# Patient Record
Sex: Male | Born: 2000 | Race: Black or African American | Hispanic: No | Marital: Single | Smoking: Never smoker
Health system: Southern US, Community
[De-identification: ages and names within clinical notes are randomized; demographics above are authoritative.]

## PROBLEM LIST (undated history)

## (undated) DIAGNOSIS — J189 Pneumonia, unspecified organism: Secondary | ICD-10-CM

## (undated) DIAGNOSIS — F909 Attention-deficit hyperactivity disorder, unspecified type: Secondary | ICD-10-CM

## (undated) HISTORY — DX: Pneumonia, unspecified organism: J18.9

---
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
## 2020-07-23 ENCOUNTER — Other Ambulatory Visit: Payer: Self-pay

## 2020-07-23 ENCOUNTER — Emergency Department (HOSPITAL_COMMUNITY)
Admission: EM | Admit: 2020-07-23 | Discharge: 2020-07-23 | Disposition: A | Discharge status: Home / Self Care | Payer: Medicaid Other | Attending: Emergency Medicine | Admitting: Emergency Medicine

## 2020-07-23 ENCOUNTER — Encounter (HOSPITAL_COMMUNITY): Payer: Self-pay | Admitting: Emergency Medicine

## 2020-07-23 DIAGNOSIS — S0181XA Laceration without foreign body of other part of head, initial encounter: Secondary | ICD-10-CM

## 2020-07-23 DIAGNOSIS — Z23 Encounter for immunization: Secondary | ICD-10-CM | POA: Insufficient documentation

## 2020-07-23 DIAGNOSIS — W500XXA Accidental hit or strike by another person, initial encounter: Secondary | ICD-10-CM | POA: Insufficient documentation

## 2020-07-23 DIAGNOSIS — Y9362 Activity, american flag or touch football: Secondary | ICD-10-CM | POA: Insufficient documentation

## 2020-07-23 DIAGNOSIS — S01112A Laceration without foreign body of left eyelid and periocular area, initial encounter: Secondary | ICD-10-CM | POA: Insufficient documentation

## 2020-07-23 MED ORDER — TETANUS-DIPHTH-ACELL PERTUSSIS 5-2.5-18.5 LF-MCG/0.5 IM SUSY
0.5000 mL | PREFILLED_SYRINGE | Freq: Once | INTRAMUSCULAR | Status: AC
  Administered 2020-07-23: 0.5 mL via INTRAMUSCULAR
  Filled 2020-07-23: qty 0.5

## 2020-07-23 MED ORDER — LIDOCAINE-EPINEPHRINE 1 %-1:100000 IJ SOLN
20.0000 mL | Freq: Once | INTRAMUSCULAR | Status: AC
  Administered 2020-07-23: 20 mL
  Filled 2020-07-23: qty 1

## 2020-07-23 MED ORDER — ACETAMINOPHEN 500 MG PO TABS
1000.0000 mg | ORAL_TABLET | Freq: Once | ORAL | Status: AC
  Administered 2020-07-23: 1000 mg via ORAL
  Filled 2020-07-23: qty 2
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
## 2020-07-23 NOTE — ED Provider Notes (Signed)
[facility] EMERGENCY DEPARTMENT Provider Note   CSN: [id number] Arrival date & time: 07/23/20  1658     History Chief Complaint  Patient presents with  . Laceration    Marcus Peters is a 20 y.o. male.  The history is provided by the patient. No language interpreter was used.  Laceration Associated symptoms: no fever      20 year old male significant history of ADHD presenting for evaluation of facial injury.  Patient report prior to arrival he was playing flag football when his head collided against another player.  He suffered a laceration to his left side of face.  He denies falling or having any loss of consciousness but does report of sharp throbbing pain to the affected area.  No pain with eye movement no vision loss no focal numbness or weakness no neck pain no other injury.  He is unable to recall his last tetanus status.  He denies any specific treatment tried.  He denies any other injury.  Pain is nonradiating.  Past Medical History:  Diagnosis Date  . ADHD (attention deficit hyperactivity disorder)   . Pneumonia     There are no problems to display for this patient.   History reviewed. No pertinent surgical history.     Family History  Problem Relation Age of Onset  . Asthma Brother   . Allergies Brother     Social History   Tobacco Use  . Smoking status: Never Smoker  . Smokeless tobacco: Never Used  Vaping Use  . Vaping Use: Never used  Substance Use Topics  . Alcohol use: No  . Drug use: Never    Home Medications Prior to Admission medications   Medication Sig Start Date End Date Taking? Authorizing Provider  ibuprofen (ADVIL,MOTRIN) 800 MG tablet Take 1 tablet (800 mg total) by mouth 3 (three) times daily. 06/18/12   Charlynne Pander, MD  mupirocin ointment (BACTROBAN) 2 % Place 1 application into the nose 2 (two) times daily as needed.    [provider]  triamcinolone cream (KENALOG) 0.1 % Apply 1 application  topically 3 (three) times daily.    [provider]    Allergies    Patient has no known allergies.  Review of Systems   Review of Systems  Constitutional: Negative for fever.  Skin: Positive for wound.  Neurological: Positive for headaches.    Physical Exam Updated Vital Signs BP (!) 163/94 (BP Location: Left Arm)   Pulse 83   Temp (!) 100.6 F (38.1 C) (Oral)   Resp 18   SpO2 100%   Physical Exam Vitals and nursing note reviewed.  Constitutional:      General: He is not in acute distress.    Appearance: He is well-developed and well-nourished.  HENT:     Head:     Comments: There is a 3 cm horizontal laceration inferior to left eyebrow without ocular involvement or eyelid involvement.  No battle sign, no racoon's eyes, no midface tenderness. Eyes:     Extraocular Movements: Extraocular movements intact.     Conjunctiva/sclera: Conjunctivae normal.     Pupils: Pupils are equal, round, and reactive to light.  Musculoskeletal:        General: Normal range of motion.     Cervical back: Neck supple.  Skin:    Findings: No rash.  Neurological:     Mental Status: He is alert and oriented to person, place, and time.  Psychiatric:  Mood and Affect: Mood and affect and mood normal.     ED Results / Procedures / Treatments   Labs (all labs ordered are listed, but only abnormal results are displayed) Labs Reviewed - No data to display  EKG None  Radiology No results found.  Procedures .Marland KitchenLaceration Repair  Date/Time: 07/23/2020 10:00 PM Performed by: Fayrene Helper, PA-C Authorized by: Fayrene Helper, PA-C   Consent:    Consent obtained:  Verbal   Consent given by:  Patient   Risks discussed:  Infection, need for additional repair, pain, poor cosmetic result and poor wound healing   Alternatives discussed:  No treatment and delayed treatment Universal protocol:    Procedure explained and questions answered to patient or proxy's satisfaction: yes      Relevant documents present and verified: yes     Test results available: yes     Imaging studies available: yes     Required blood products, implants, devices, and special equipment available: yes     Site/side marked: yes     Immediately prior to procedure, a time out was called: yes     Patient identity confirmed:  Verbally with patient Anesthesia:    Anesthesia method:  Local infiltration   Local anesthetic:  Lidocaine 2% WITH epi Laceration details:    Location:  Face   Face location:  L upper eyelid   Extent:  Superficial   Length (cm):  3   Depth (mm):  2 Exploration:    Limited defect created (wound extended): no     Hemostasis achieved with:  Epinephrine   Imaging outcome: foreign body not noted     Wound exploration: wound explored through full range of motion and entire depth of wound visualized     Wound extent: no underlying fracture noted     Contaminated: no   Treatment:    Area cleansed with:  Povidone-iodine and saline   Amount of cleaning:  Standard   Irrigation solution:  Sterile saline   Irrigation method:  Pressure wash   Visualized foreign bodies/material removed: no     Debridement:  None   Undermining:  None   Scar revision: no   Skin repair:    Repair method:  Sutures   Suture size:  6-0   Suture material:  Chromic gut   Suture technique:  Simple interrupted   Number of sutures:  6 Approximation:    Approximation:  Close Repair type:    Repair type:  Intermediate Post-procedure details:    Dressing:  Non-adherent dressing   Procedure completion:  Tolerated well, no immediate complications     Medications Ordered in ED Medications  lidocaine-EPINEPHrine (XYLOCAINE W/EPI) 1 %-1:100000 (with pres) injection 20 mL (has no administration in time range)  Tdap (BOOSTRIX) injection 0.5 mL (0.5 mLs Intramuscular Given 07/23/20 2109)  acetaminophen (TYLENOL) tablet 1,000 mg (1,000 mg Oral Given 07/23/20 2109)    ED Course  I have reviewed the triage  vital signs and the nursing notes.  Pertinent labs & imaging results that were available during my care of the patient were reviewed by me and considered in my medical decision making (see chart for details).    MDM Rules/Calculators/A&P                          BP 112/70   Pulse 67   Temp (!) 100.6 F (38.1 C) (Oral)   Resp 18   SpO2 99%   Final Clinical  Impression(s) / ED Diagnoses Final diagnoses:  Facial laceration, initial encounter    Rx / DC Orders ED Discharge Orders    None     9:01 PM Patient collide his  head against another player during flag football today.  He suffered a laceration below his left eyebrow amenable for laceration repair.  He does not have any significant signs of head trauma aside from the laceration.  He is mentating appropriate, do not think advanced imaging is indicated at this time.  Will update tetanus, and will perform laceration repair.  He does have a temperature of 100.6 however he denies any cold symptoms or any other symptom concerning for infection.   Fayrene Helper, PA-C 07/23/20 2238    Horton, Clabe Seal, DO 07/24/20 0018

## 2020-07-23 NOTE — ED Notes (Signed)
Reviewed discharge instructions with patient. Follow-up care reviewed. Patient verbalized understanding. Patient A&Ox4, VSS, and ambulatory with steady gait upon discharge.  

## 2020-07-23 NOTE — ED Triage Notes (Signed)
Approx 1 inch laceration under L eyebrow.  States he got hit while playing football.  Denies LOC. Bandage in place and bleeding controlled.

## 2021-04-12 ENCOUNTER — Encounter: Payer: Self-pay | Admitting: Emergency Medicine

## 2021-04-12 ENCOUNTER — Other Ambulatory Visit: Payer: Self-pay

## 2021-04-12 ENCOUNTER — Ambulatory Visit
Admission: EM | Admit: 2021-04-12 | Discharge: 2021-04-12 | Disposition: A | Discharge status: Home / Self Care | Payer: Self-pay | Attending: Internal Medicine | Admitting: Internal Medicine

## 2021-04-12 DIAGNOSIS — Z20822 Contact with and (suspected) exposure to covid-19: Secondary | ICD-10-CM

## 2021-04-12 DIAGNOSIS — R6889 Other general symptoms and signs: Secondary | ICD-10-CM

## 2021-04-12 DIAGNOSIS — J069 Acute upper respiratory infection, unspecified: Secondary | ICD-10-CM

## 2021-04-12 MED ORDER — DM-GUAIFENESIN ER 30-600 MG PO TB12: 1.0000 | ORAL_TABLET | Freq: Two times a day (BID) | ORAL | 0 refills | Status: AC

## 2021-04-12 NOTE — ED Triage Notes (Signed)
Patient c/o productive cough and chest congestion x 1 week.  Denies any OTC cold meds.  Patient is not vaccinated for COVID.

## 2021-04-12 NOTE — ED Provider Notes (Signed)
[facility]    CSN: [id number] Arrival date & time: 04/12/21  1353      History   Chief Complaint Chief Complaint  Patient presents with   Cough    HPI Surya Schroeter is a 20 y.o. male.   Presents with cough and nasal congestion that has been present for 1 week.  Patient reports that significant other had similar symptoms recently.  Cough is productive with yellow sputum.  Denies any known fevers.  Denies chest pain, shortness of breath, nausea, vomiting, diarrhea, sore throat, ear pain.  Patient has taken a "orange cough medication" over-the-counter with minimal improvement in symptoms.   Cough  Past Medical History:  Diagnosis Date   ADHD (attention deficit hyperactivity disorder)    Pneumonia     There are no problems to display for this patient.   History reviewed. No pertinent surgical history.     Home Medications    Prior to Admission medications   Medication Sig Start Date End Date Taking? Authorizing Provider  dextromethorphan-guaiFENesin (MUCINEX DM) 30-600 MG 12hr tablet Take 1 tablet by mouth 2 (two) times daily. 04/12/21  Yes Henretter Piekarski, Rolly Salter E, FNP  ibuprofen (ADVIL,MOTRIN) 800 MG tablet Take 1 tablet (800 mg total) by mouth 3 (three) times daily. 06/18/12   Charlynne Pander, MD  mupirocin ointment (BACTROBAN) 2 % Place 1 application into the nose 2 (two) times daily as needed.    [provider]  triamcinolone cream (KENALOG) 0.1 % Apply 1 application topically 3 (three) times daily.    [provider]    Family History Family History  Problem Relation Age of Onset   Asthma Brother    Allergies Brother     Social History Social History   Tobacco Use   Smoking status: Never   Smokeless tobacco: Never  Vaping Use   Vaping Use: Never used  Substance Use Topics   Alcohol use: No   Drug use: Never     Allergies   Patient has no known allergies.   Review of Systems Review of Systems Per HPI  Physical  Exam Triage Vital Signs ED Triage Vitals  Enc Vitals Group     BP 04/12/21 1518 123/69     Pulse Rate 04/12/21 1518 93     Resp 04/12/21 1518 18     Temp 04/12/21 1518 98.1 F (36.7 C)     Temp Source 04/12/21 1518 Oral     SpO2 04/12/21 1518 98 %     Weight 04/12/21 1519 203 lb (92.1 kg)     Height 04/12/21 1519 6\' 1"  (1.854 m)     Head Circumference --      Peak Flow --      Pain Score 04/12/21 1519 0     Pain Loc --      Pain Edu? --      Excl. in [place]? --    No data found.  Updated Vital Signs BP 123/69 (BP Location: Left Arm)   Pulse 93   Temp 98.1 F (36.7 C) (Oral)   Resp 18   Ht 6\' 1"  (1.854 m)   Wt 203 lb (92.1 kg)   SpO2 98%   BMI 26.78 kg/m   Visual Acuity Right Eye Distance:   Left Eye Distance:   Bilateral Distance:    Right Eye Near:   Left Eye Near:    Bilateral Near:     Physical Exam Constitutional:      General: He is not  in acute distress.    Appearance: Normal appearance. He is not toxic-appearing or diaphoretic.  HENT:     Head: Normocephalic and atraumatic.     Right Ear: Tympanic membrane and ear canal normal.     Left Ear: Tympanic membrane and ear canal normal.     Nose: Congestion present.     Mouth/Throat:     Mouth: Mucous membranes are moist.     Pharynx: No posterior oropharyngeal erythema.  Eyes:     Extraocular Movements: Extraocular movements intact.     Conjunctiva/sclera: Conjunctivae normal.     Pupils: Pupils are equal, round, and reactive to light.  Cardiovascular:     Rate and Rhythm: Normal rate and regular rhythm.     Pulses: Normal pulses.     Heart sounds: Normal heart sounds.  Pulmonary:     Effort: Pulmonary effort is normal. No respiratory distress.     Breath sounds: Normal breath sounds. No stridor. No wheezing, rhonchi or rales.  Abdominal:     General: Abdomen is flat. Bowel sounds are normal.     Palpations: Abdomen is soft.  Musculoskeletal:        General: Normal range of motion.     Cervical  back: Normal range of motion.  Skin:    General: Skin is warm and dry.  Neurological:     General: No focal deficit present.     Mental Status: He is alert and oriented to person, place, and time. Mental status is at baseline.  Psychiatric:        Mood and Affect: Mood normal.        Behavior: Behavior normal.     UC Treatments / Results  Labs (all labs ordered are listed, but only abnormal results are displayed) Labs Reviewed  COVID-19, FLU A+B NAA    EKG   Radiology No results found.  Procedures Procedures (including critical care time)  Medications Ordered in UC Medications - No data to display  Initial Impression / Assessment and Plan / UC Course  I have reviewed the triage vital signs and the nursing notes.  Pertinent labs & imaging results that were available during my care of the patient were reviewed by me and considered in my medical decision making (see chart for details).     Patient presents with symptoms likely from a viral upper respiratory infection. Differential includes bacterial pneumonia, sinusitis, allergic rhinitis, COVID-19, flu. Do not suspect underlying cardiopulmonary process. Symptoms seem unlikely related to ACS, CHF or COPD exacerbations, pneumonia, pneumothorax. Patient is nontoxic appearing and not in need of emergent medical intervention.  COVID-19, flu test pending.  Recommended symptom control with over the counter medications: Daily oral anti-histamine, Oral decongestant or IN corticosteroid, saline irrigations, cepacol lozenges, Robitussin, Delsym, honey tea.  Mucinex DM prescribed for patient.  Do not think chest imaging is necessary given no adventitious lung sounds on exam.  Return if symptoms fail to improve in 1-2 weeks or you develop shortness of breath, chest pain, severe headache. Patient states understanding and is agreeable.  Discharged with PCP followup.  Final Clinical Impressions(s) / UC Diagnoses   Final diagnoses:   Viral upper respiratory tract infection with cough  Flu-like symptoms  Encounter for laboratory testing for COVID-19 virus     Discharge Instructions      It appears that you have a viral upper respiratory infection that should resolve in the next few days with symptomatic treatment.  You have been prescribed a medication to help  treat this.  Please follow-up if symptoms persist.  COVID-19 test is pending.  We will call if it is positive.    ED Prescriptions     Medication Sig Dispense Auth. Provider   dextromethorphan-guaiFENesin (MUCINEX DM) 30-600 MG 12hr tablet Take 1 tablet by mouth 2 (two) times daily. 30 tablet [place], Acie Fredrickson, [place]      PDMP not reviewed this encounter.   Gustavus Bryant, [place] 04/12/21 [phone]

## 2021-04-12 NOTE — Discharge Instructions (Signed)
It appears that you have a viral upper respiratory infection that should resolve in the next few days with symptomatic treatment.  You have been prescribed a medication to help treat this.  Please follow-up if symptoms persist.  COVID-19 test is pending.  We will call if it is positive.

## 2021-04-13 LAB — COVID-19, FLU A+B NAA
Influenza A, NAA: NOT DETECTED
Influenza B, NAA: NOT DETECTED
SARS-CoV-2, NAA: NOT DETECTED

## 2021-08-22 ENCOUNTER — Ambulatory Visit
Admission: EM | Admit: 2021-08-22 | Discharge: 2021-08-22 | Disposition: A | Discharge status: Home / Self Care | Payer: 59 | Attending: Internal Medicine | Admitting: Internal Medicine

## 2021-08-22 ENCOUNTER — Other Ambulatory Visit: Payer: Self-pay

## 2021-08-22 ENCOUNTER — Encounter: Payer: Self-pay | Admitting: Emergency Medicine

## 2021-08-22 DIAGNOSIS — J020 Streptococcal pharyngitis: Secondary | ICD-10-CM | POA: Diagnosis not present

## 2021-08-22 LAB — POCT RAPID STREP A (OFFICE): Rapid Strep A Screen: POSITIVE — AB

## 2021-08-22 MED ORDER — AMOXICILLIN 500 MG PO CAPS: 500.0000 mg | ORAL_CAPSULE | Freq: Two times a day (BID) | ORAL | 0 refills | Status: AC

## 2021-08-22 NOTE — Discharge Instructions (Signed)
You have strep throat which is being treated with amoxicillin.  Follow-up if symptoms persist or worsen. ?

## 2021-08-22 NOTE — ED Provider Notes (Signed)
?[place] ? ? ? ?CSN: [id number] ?Arrival date & time: 08/22/21  1133 ? ? ?  ? ?History   ?Chief Complaint ?Chief Complaint  ?Patient presents with  ? Sore Throat  ? ? ?HPI ?Marcus Peters is a 21 y.o. male.  ? ?Patient presents with sore throat and tonsillitis that has been present for approximately 2 days.  Denies any other associated symptoms including nasal congestion, runny nose, cough, fever.  Family member has had similar symptoms recently.  Denies chest pain, shortness of breath, nausea, vomiting, diarrhea, abdominal pain.  Patient has not taken medications to help alleviate symptoms. ? ? ?Sore Throat ? ? ?Past Medical History:  ?Diagnosis Date  ? ADHD (attention deficit hyperactivity disorder)   ? Pneumonia   ? ? ?There are no problems to display for this patient. ? ? ?History reviewed. No pertinent surgical history. ? ? ? ? ?Home Medications   ? ?Prior to Admission medications   ?Medication Sig Start Date End Date Taking? Authorizing Provider  ?amoxicillin (AMOXIL) 500 MG capsule Take 1 capsule (500 mg total) by mouth 2 (two) times daily for 10 days. 08/22/21 09/01/21 Yes Teodora Medici, FNP  ?dextromethorphan-guaiFENesin (MUCINEX DM) 30-600 MG 12hr tablet Take 1 tablet by mouth 2 (two) times daily. 04/12/21   Teodora Medici, FNP  ?ibuprofen (ADVIL,MOTRIN) 800 MG tablet Take 1 tablet (800 mg total) by mouth 3 (three) times daily. 06/18/12   Drenda Freeze, MD  ?mupirocin ointment (BACTROBAN) 2 % Place 1 application into the nose 2 (two) times daily as needed.    [provider]  ?triamcinolone cream (KENALOG) 0.1 % Apply 1 application topically 3 (three) times daily.    [provider]  ? ? ?Family History ?Family History  ?Problem Relation Age of Onset  ? Asthma Brother   ? Allergies Brother   ? ? ?Social History ?Social History  ? ?Tobacco Use  ? Smoking status: Never  ? Smokeless tobacco: Never  ?Vaping Use  ? Vaping Use: Never used  ?Substance Use Topics  ? Alcohol use:  No  ? Drug use: Never  ? ? ? ?Allergies   ?Patient has no known allergies. ? ? ?Review of Systems ?Review of Systems ?Per HPI ? ?Physical Exam ?Triage Vital Signs ?ED Triage Vitals  ?Enc Vitals Group  ?   BP 08/22/21 1250 (!) 146/93  ?   Pulse Rate 08/22/21 1250 84  ?   Resp 08/22/21 1250 18  ?   Temp 08/22/21 1250 97.6 ?F (36.4 ?C)  ?   Temp Source 08/22/21 1250 Oral  ?   SpO2 08/22/21 1250 97 %  ?   Weight --   ?   Height --   ?   Head Circumference --   ?   Peak Flow --   ?   Pain Score 08/22/21 1251 8  ?   Pain Loc --   ?   Pain Edu? --   ?   Excl. in [place]? --   ? ?No data found. ? ?Updated Vital Signs ?BP (!) 146/93 (BP Location: Left Arm)   Pulse 84   Temp 97.6 ?F (36.4 ?C) (Oral)   Resp 18   SpO2 97%  ? ?Visual Acuity ?Right Eye Distance:   ?Left Eye Distance:   ?Bilateral Distance:   ? ?Right Eye Near:   ?Left Eye Near:    ?Bilateral Near:    ? ?Physical Exam ?Constitutional:   ?   General: He is  not in acute distress. ?   Appearance: Normal appearance. He is not toxic-appearing or diaphoretic.  ?HENT:  ?   Head: Normocephalic and atraumatic.  ?   Right Ear: Tympanic membrane and ear canal normal.  ?   Left Ear: Tympanic membrane and ear canal normal.  ?   Nose: Nose normal.  ?   Mouth/Throat:  ?   Mouth: Mucous membranes are moist.  ?   Pharynx: Posterior oropharyngeal erythema present. No oropharyngeal exudate.  ?   Tonsils: No tonsillar exudate. 1+ on the right. 1+ on the left.  ?Eyes:  ?   Extraocular Movements: Extraocular movements intact.  ?   Conjunctiva/sclera: Conjunctivae normal.  ?   Pupils: Pupils are equal, round, and reactive to light.  ?Cardiovascular:  ?   Rate and Rhythm: Normal rate and regular rhythm.  ?   Pulses: Normal pulses.  ?   Heart sounds: Normal heart sounds.  ?Pulmonary:  ?   Effort: Pulmonary effort is normal. No respiratory distress.  ?   Breath sounds: Normal breath sounds. No wheezing.  ?Skin: ?   General: Skin is warm and dry.  ?Neurological:  ?   General: No focal  deficit present.  ?   Mental Status: He is alert and oriented to person, place, and time. Mental status is at baseline.  ?Psychiatric:     ?   Mood and Affect: Mood normal.     ?   Behavior: Behavior normal.     ?   Thought Content: Thought content normal.     ?   Judgment: Judgment normal.  ? ? ? ?UC Treatments / Results  ?Labs ?(all labs ordered are listed, but only abnormal results are displayed) ?Labs Reviewed  ?POCT RAPID STREP A (OFFICE) - Abnormal; Notable for the following components:  ?    Result Value  ? Rapid Strep A Screen Positive (*)   ? All other components within normal limits  ? ? ?EKG ? ? ?Radiology ?No results found. ? ?Procedures ?Procedures (including critical care time) ? ?Medications Ordered in UC ?Medications - No data to display ? ?Initial Impression / Assessment and Plan / UC Course  ?I have reviewed the triage vital signs and the nursing notes. ? ?Pertinent labs & imaging results that were available during my care of the patient were reviewed by me and considered in my medical decision making (see chart for details). ? ?  ? ?Strep was positive.  Will treat with amoxicillin antibiotic.  No signs of peritonsillar abscess on exam.  Discussed symptom management and supportive care with patient.  Patient verbalized understanding and was agreeable with plan. ?Final Clinical Impressions(s) / UC Diagnoses  ? ?Final diagnoses:  ?Strep pharyngitis  ? ? ? ?Discharge Instructions   ? ?  ?You have strep throat which is being treated with amoxicillin.  Follow-up if symptoms persist or worsen. ? ? ? ?ED Prescriptions   ? ? Medication Sig Dispense Auth. Provider  ? amoxicillin (AMOXIL) 500 MG capsule Take 1 capsule (500 mg total) by mouth 2 (two) times daily for 10 days. 20 capsule Oswaldo Conroy E, [place]  ? ?  ? ?PDMP not reviewed this encounter. ?  ?Teodora Medici,  ?08/22/21 1354 ? ?

## 2021-08-22 NOTE — ED Triage Notes (Signed)
Pt here for sore throat and swelling x 2 days  ?

## 2022-04-28 ENCOUNTER — Ambulatory Visit
Admission: EM | Admit: 2022-04-28 | Discharge: 2022-04-28 | Disposition: A | Discharge status: Home / Self Care | Payer: 59 | Attending: Physician Assistant | Admitting: Physician Assistant

## 2022-04-28 DIAGNOSIS — Z202 Contact with and (suspected) exposure to infections with a predominantly sexual mode of transmission: Secondary | ICD-10-CM | POA: Insufficient documentation

## 2022-04-28 MED ORDER — METRONIDAZOLE 500 MG PO TABS: 2000.0000 mg | ORAL_TABLET | Freq: Every day | ORAL | 0 refills | Status: AC

## 2022-04-28 NOTE — ED Provider Notes (Signed)
[facility]    CSN: [id number] Arrival date & time: 04/28/22  1059      History   Chief Complaint No chief complaint on file.   HPI Marcus Peters is a 21 y.o. male.   Patient here concerned with exposure to trich. His GF states she tested positive.  He denies any sx.      Past Medical History:  Diagnosis Date   ADHD (attention deficit hyperactivity disorder)    Pneumonia     There are no problems to display for this patient.   History reviewed. No pertinent surgical history.     Home Medications    Prior to Admission medications   Medication Sig Start Date End Date Taking? Authorizing Provider  metroNIDAZOLE (FLAGYL) 500 MG tablet Take 4 tablets (2,000 mg total) by mouth daily for 1 day. 04/28/22 04/29/22 Yes Evern Core, PA-C  dextromethorphan-guaiFENesin [facility] DM) 30-600 MG 12hr tablet Take 1 tablet by mouth 2 (two) times daily. 04/12/21   Gustavus Bryant, FNP  ibuprofen (ADVIL,MOTRIN) 800 MG tablet Take 1 tablet (800 mg total) by mouth 3 (three) times daily. 06/18/12   Charlynne Pander, MD  mupirocin ointment (BACTROBAN) 2 % Place 1 application into the nose 2 (two) times daily as needed.    [provider]  triamcinolone cream (KENALOG) 0.1 % Apply 1 application topically 3 (three) times daily.    [provider]    Family History Family History  Problem Relation Age of Onset   Asthma Brother    Allergies Brother     Social History Social History   Tobacco Use   Smoking status: Never   Smokeless tobacco: Never  Vaping Use   Vaping Use: Never used  Substance Use Topics   Alcohol use: No   Drug use: Never     Allergies   Patient has no known allergies.   Review of Systems Review of Systems  Constitutional:  Negative for chills, fatigue and fever.  Gastrointestinal:  Negative for abdominal pain, diarrhea, nausea and vomiting.  Genitourinary:  Negative for dysuria, hematuria, penile discharge, penile pain,  penile swelling, scrotal swelling, testicular pain and urgency.  Musculoskeletal:  Negative for arthralgias and myalgias.  Skin:  Negative for rash.  Neurological:  Negative for light-headedness and headaches.  Hematological:  Negative for adenopathy. Does not bruise/bleed easily.  Psychiatric/Behavioral:  Negative for confusion and sleep disturbance.      Physical Exam Triage Vital Signs ED Triage Vitals  Enc Vitals Group     BP 04/28/22 1246 (!) 144/92     Pulse Rate 04/28/22 1246 62     Resp 04/28/22 1246 20     Temp 04/28/22 1246 98.3 F (36.8 C)     Temp Source 04/28/22 1246 Oral     SpO2 04/28/22 1246 98 %     Weight --      Height --      Head Circumference --      Peak Flow --      Pain Score 04/28/22 1251 0     Pain Loc --      Pain Edu? --      Excl. in GC? --    No data found.  Updated Vital Signs BP (!) 144/92 (BP Location: Left Arm)   Pulse 62   Temp 98.3 F (36.8 C) (Oral)   Resp 20   SpO2 98%   Visual Acuity Right Eye Distance:   Left Eye Distance:   Bilateral Distance:  Right Eye Near:   Left Eye Near:    Bilateral Near:     Physical Exam Vitals and nursing note reviewed.  Constitutional:      General: He is not in acute distress.    Appearance: He is well-developed.  HENT:     Head: Normocephalic and atraumatic.  Eyes:     Extraocular Movements: Extraocular movements intact.     Conjunctiva/sclera: Conjunctivae normal.  Pulmonary:     Effort: Pulmonary effort is normal. No respiratory distress.  Musculoskeletal:        General: No swelling.     Cervical back: Normal range of motion and neck supple. No rigidity.  Skin:    General: Skin is warm and dry.     Capillary Refill: Capillary refill takes less than 2 seconds.  Neurological:     Mental Status: He is alert.     Motor: No weakness.     Gait: Gait normal.  Psychiatric:        Mood and Affect: Mood normal.      UC Treatments / Results  Labs (all labs ordered are  listed, but only abnormal results are displayed) Labs Reviewed  CYTOLOGY, (ORAL, ANAL, URETHRAL) ANCILLARY ONLY    EKG   Radiology No results found.  Procedures Procedures (including critical care time)  Medications Ordered in UC Medications - No data to display  Initial Impression / Assessment and Plan / UC Course  I have reviewed the triage vital signs and the nursing notes.  Pertinent labs & imaging results that were available during my care of the patient were reviewed by me and considered in my medical decision making (see chart for details).     Take medication as prescribed No sexual activity for 1 week No alcohol while taking antibiotics Lab results available in 2 - 5 days on my chart. Final Clinical Impressions(s) / UC Diagnoses   Final diagnoses:  Exposure to trichomonas     Discharge Instructions      Take medication as prescribed No sexual activity for 1 week No alcohol while taking antibiotics.    ED Prescriptions     Medication Sig Dispense Auth. Provider   metroNIDAZOLE (FLAGYL) 500 MG tablet Take 4 tablets (2,000 mg total) by mouth daily for 1 day. 4 tablet Evern Core, PA-C      PDMP not reviewed this encounter.   Evern Core, PA-C 04/28/22 1320

## 2022-04-28 NOTE — Discharge Instructions (Signed)
Take medication as prescribed No sexual activity for 1 week No alcohol while taking antibiotics.

## 2022-04-28 NOTE — ED Triage Notes (Signed)
Pt reports he has had a new partner and she told him she has something. Is requesting std test. Not experiencing symtpoms

## 2022-04-30 LAB — CYTOLOGY, (ORAL, ANAL, URETHRAL) ANCILLARY ONLY
Chlamydia: NEGATIVE
Comment: NEGATIVE
Comment: NEGATIVE
Comment: NORMAL
Neisseria Gonorrhea: NEGATIVE
Trichomonas: NEGATIVE
